# Patient Record
Sex: Female | Born: 1995 | Race: White | Hispanic: No | Marital: Single | Smoking: Never smoker
Health system: Southern US, Community
[De-identification: ages and names within clinical notes are randomized; demographics above are authoritative.]

## PROBLEM LIST (undated history)

## (undated) DIAGNOSIS — M1990 Unspecified osteoarthritis, unspecified site: Secondary | ICD-10-CM

## (undated) DIAGNOSIS — K5090 Crohn's disease, unspecified, without complications: Secondary | ICD-10-CM

## (undated) DIAGNOSIS — M069 Rheumatoid arthritis, unspecified: Secondary | ICD-10-CM

## (undated) HISTORY — PX: KNEE ARTHROSCOPY: SUR90

---

## 2017-01-17 ENCOUNTER — Ambulatory Visit (HOSPITAL_COMMUNITY)
Admission: EM | Admit: 2017-01-17 | Discharge: 2017-01-17 | Disposition: A | Discharge status: Home / Self Care | Payer: BLUE CROSS/BLUE SHIELD | Attending: Internal Medicine | Admitting: Internal Medicine

## 2017-01-17 ENCOUNTER — Encounter (HOSPITAL_COMMUNITY): Payer: Self-pay | Admitting: Emergency Medicine

## 2017-01-17 DIAGNOSIS — J029 Acute pharyngitis, unspecified: Secondary | ICD-10-CM | POA: Insufficient documentation

## 2017-01-17 DIAGNOSIS — R509 Fever, unspecified: Secondary | ICD-10-CM | POA: Diagnosis present

## 2017-01-17 DIAGNOSIS — Z79899 Other long term (current) drug therapy: Secondary | ICD-10-CM | POA: Diagnosis not present

## 2017-01-17 DIAGNOSIS — R51 Headache: Secondary | ICD-10-CM | POA: Insufficient documentation

## 2017-01-17 DIAGNOSIS — R0981 Nasal congestion: Secondary | ICD-10-CM | POA: Diagnosis present

## 2017-01-17 DIAGNOSIS — J069 Acute upper respiratory infection, unspecified: Secondary | ICD-10-CM

## 2017-01-17 LAB — POCT RAPID STREP A: STREPTOCOCCUS, GROUP A SCREEN (DIRECT): NEGATIVE

## 2017-01-17 MED ORDER — FLUTICASONE PROPIONATE 50 MCG/ACT NA SUSP: 1.0000 | Freq: Every day | NASAL | 2 refills | Status: DC

## 2017-01-17 MED ORDER — AMOXICILLIN-POT CLAVULANATE 875-125 MG PO TABS: 1.0000 | ORAL_TABLET | Freq: Two times a day (BID) | ORAL | 0 refills | Status: DC

## 2017-01-17 NOTE — ED Triage Notes (Signed)
Pt here for ST onset 2 days associated w/fevers, odynophagia  Has not had any meds  A&O x4... NAD... Ambulatory

## 2017-01-17 NOTE — Discharge Instructions (Signed)
Continue to push fluids and take over the counter medications as directed on the back of the box for symptomatic relief.  ° °

## 2017-01-17 NOTE — ED Provider Notes (Signed)
[facility]    CSN: [id number] Arrival date & time: 01/17/17  1842     History   Chief Complaint Chief Complaint  Patient presents with  . Sore Throat    HPI Alexandra Sanders is a 21 y.o. female.   21 y.o. female presents with fever, sore throat, headache, sinus pressure and nasal congestion  X 4 days.. Condition is acute  in nature. Condition is made better by nothing. Condition is made worse by nothing. Patient denies any treatment prior to there arrival at this facility. Patient denies any nausea vomiting, diarrhea. and endorses sick contacts.        History reviewed. No pertinent past medical history.  There are no active problems to display for this patient.   History reviewed. No pertinent surgical history.  OB History    No data available       Home Medications    Prior to Admission medications   Medication Sig Start Date End Date Taking? Authorizing Provider  amitriptyline (ELAVIL) 10 MG tablet Take 10 mg by mouth at bedtime.   Yes [provider]  meloxicam (MOBIC) 15 MG tablet Take 15 mg by mouth daily.   Yes [provider]  Norgestimate-Ethinyl Estradiol Triphasic (TRINESSA LO) 0.18/0.215/0.25 MG-25 MCG tab Take 1 tablet by mouth daily.   Yes [provider]    Family History History reviewed. No pertinent family history.  Social History Social History  Substance Use Topics  . Smoking status: Never Smoker  . Smokeless tobacco: Never Used  . Alcohol use Yes     Allergies   Patient has no known allergies.   Review of Systems Review of Systems  Constitutional: Positive for fever. Negative for chills.  HENT: Positive for rhinorrhea, sinus pain, sinus pressure and sore throat. Negative for ear pain.   Eyes: Negative for pain and visual disturbance.  Respiratory: Negative for cough and shortness of breath.   Cardiovascular: Negative for chest pain and palpitations.  Gastrointestinal: Negative for abdominal  pain and vomiting.  Genitourinary: Negative for dysuria and hematuria.  Musculoskeletal: Negative for arthralgias and back pain.  Skin: Negative for color change and rash.  Neurological: Negative for seizures and syncope.  All other systems reviewed and are negative.    Physical Exam Triage Vital Signs ED Triage Vitals  Enc Vitals Group     BP --      Pulse Rate 01/17/17 1951 80     Resp 01/17/17 1951 16     Temp 01/17/17 1951 98.6 F (37 C)     Temp Source 01/17/17 1951 Oral     SpO2 01/17/17 1951 99 %     Weight --      Height --      Head Circumference --      Peak Flow --      Pain Score 01/17/17 1952 3     Pain Loc --      Pain Edu? --      Excl. in GC? --    No data found.   Updated Vital Signs Pulse 80   Temp 98.6 F (37 C) (Oral)   Resp 16   LMP 01/17/2017   SpO2 99%   Visual Acuity Right Eye Distance:   Left Eye Distance:   Bilateral Distance:    Right Eye Near:   Left Eye Near:    Bilateral Near:     Physical Exam  HENT:  Pain with palpation to frontal ethmoid and maxillary sinus  UC Treatments / Results  Labs (all labs ordered are listed, but only abnormal results are displayed) Labs Reviewed  POCT RAPID STREP A    EKG  EKG Interpretation None       Radiology No results found.  Procedures Procedures (including critical care time)  Medications Ordered in UC Medications - No data to display   Initial Impression / Assessment and Plan / UC Course  I have reviewed the triage vital signs and the nursing notes.  Pertinent labs & imaging results that were available during my care of the patient were reviewed by me and considered in my medical decision making (see chart for details).       Final Clinical Impressions(s) / UC Diagnoses   Final diagnoses:  None    New Prescriptions New Prescriptions   No medications on file     Controlled Substance Prescriptions [place] Controlled Substance Registry consulted? Not  Applicable   Alene Mires, NP 01/17/17 2031

## 2017-01-20 LAB — CULTURE, GROUP A STREP (THRC)

## 2017-06-06 ENCOUNTER — Ambulatory Visit (INDEPENDENT_AMBULATORY_CARE_PROVIDER_SITE_OTHER): Payer: BLUE CROSS/BLUE SHIELD

## 2017-06-06 ENCOUNTER — Encounter (HOSPITAL_COMMUNITY): Payer: Self-pay | Admitting: Emergency Medicine

## 2017-06-06 ENCOUNTER — Ambulatory Visit (HOSPITAL_COMMUNITY)
Admission: EM | Admit: 2017-06-06 | Discharge: 2017-06-06 | Disposition: A | Discharge status: Home / Self Care | Payer: BLUE CROSS/BLUE SHIELD | Attending: Family Medicine | Admitting: Family Medicine

## 2017-06-06 DIAGNOSIS — B349 Viral infection, unspecified: Secondary | ICD-10-CM | POA: Diagnosis not present

## 2017-06-06 DIAGNOSIS — R0602 Shortness of breath: Secondary | ICD-10-CM | POA: Diagnosis not present

## 2017-06-06 MED ORDER — CETIRIZINE-PSEUDOEPHEDRINE ER 5-120 MG PO TB12: 1.0000 | ORAL_TABLET | Freq: Every day | ORAL | 0 refills | Status: AC

## 2017-06-06 MED ORDER — FLUTICASONE PROPIONATE 50 MCG/ACT NA SUSP: 2.0000 | Freq: Every day | NASAL | 0 refills | Status: AC

## 2017-06-06 MED ORDER — PREDNISONE 20 MG PO TABS: 40.0000 mg | ORAL_TABLET | Freq: Every day | ORAL | 0 refills | Status: AC

## 2017-06-06 MED ORDER — BENZONATATE 100 MG PO CAPS: 100.0000 mg | ORAL_CAPSULE | Freq: Three times a day (TID) | ORAL | 0 refills | Status: DC

## 2017-06-06 NOTE — ED Provider Notes (Signed)
[facility]    CSN: [id number] Arrival date & time: 06/06/17  [id number]     History   Chief Complaint Chief Complaint  Patient presents with  . URI    HPI Alexandra Sanders is a 22 y.o. female.   22 year old female comes in for 4-day history of URI symptoms.  She has had productive cough that became blood tinged, but coughed up more blood today.  She is also had nasal congestion, rhinorrhea, sore throat. Denies fever, chills, night sweats. OTC DayQuil and NyQuil with some relief. States has had some dyspnea on exertion. Has had some wheezing, denies history of asthma. Never smoker. Negative sick contact.       History reviewed. No pertinent past medical history.  There are no active problems to display for this patient.   History reviewed. No pertinent surgical history.  OB History    No data available       Home Medications    Prior to Admission medications   Medication Sig Start Date End Date Taking? Authorizing Provider  acetaminophen (TYLENOL 8 HOUR ARTHRITIS PAIN) 650 MG CR tablet Take 650 mg by mouth every 8 (eight) hours as needed for pain.   Yes [provider]  amitriptyline (ELAVIL) 10 MG tablet Take 10 mg by mouth at bedtime.   Yes [provider]  meloxicam (MOBIC) 15 MG tablet Take 15 mg by mouth daily.   Yes [provider]  Norgestimate-Ethinyl Estradiol Triphasic (TRINESSA LO) 0.18/0.215/0.25 MG-25 MCG tab Take 1 tablet by mouth daily.   Yes [provider]  traMADol (ULTRAM) 50 MG tablet Take by mouth every 6 (six) hours as needed.   Yes [provider]  benzonatate (TESSALON) 100 MG capsule Take 1 capsule (100 mg total) by mouth every 8 (eight) hours. 06/06/17   Cathie HoopsYu, Kasten Leveque V, PA-C  cetirizine-pseudoephedrine (ZYRTEC-D) 5-120 MG tablet Take 1 tablet by mouth daily. 06/06/17   Cathie HoopsYu, Kailia Starry V, PA-C  fluticasone (FLONASE) 50 MCG/ACT nasal spray Place 2 sprays into both nostrils daily. 06/06/17   Cathie HoopsYu, Fuad Forget V, PA-C    predniSONE (DELTASONE) 20 MG tablet Take 2 tablets (40 mg total) by mouth daily for 4 days. 06/06/17 06/10/17  Belinda FisherYu, Mekai Wilkinson V, PA-C    Family History No family history on file.  Social History Social History   Tobacco Use  . Smoking status: Never Smoker  . Smokeless tobacco: Never Used  Substance Use Topics  . Alcohol use: Yes  . Drug use: No     Allergies   Patient has no known allergies.   Review of Systems Review of Systems  Reason unable to perform ROS: See HPI as above.     Physical Exam Triage Vital Signs ED Triage Vitals [06/06/17 1946]  Enc Vitals Group     BP 130/87     Pulse Rate 88     Resp 14     Temp 98.9 F (37.2 C)     Temp Source Oral     SpO2 100 %     Weight      Height      Head Circumference      Peak Flow      Pain Score      Pain Loc      Pain Edu?      Excl. in GC?    No data found.  Updated Vital Signs BP 130/87 (BP Location: Left Arm)   Pulse 88   Temp 98.9 F (37.2 C) (Oral)  Resp 14   LMP 05/29/2017 (Exact Date)   SpO2 100%   Physical Exam  Constitutional: She is oriented to person, place, and time. She appears well-developed and well-nourished. No distress.  HENT:  Head: Normocephalic and atraumatic.  Right Ear: External ear and ear canal normal. Tympanic membrane is erythematous. Tympanic membrane is not bulging.  Left Ear: External ear and ear canal normal. Tympanic membrane is erythematous. Tympanic membrane is not bulging.  Nose: Mucosal edema and rhinorrhea present. Right sinus exhibits maxillary sinus tenderness and frontal sinus tenderness. Left sinus exhibits maxillary sinus tenderness and frontal sinus tenderness.  Mouth/Throat: Uvula is midline, oropharynx is clear and moist and mucous membranes are normal.  Eyes: Conjunctivae are normal. Pupils are equal, round, and reactive to light.  Neck: Normal range of motion. Neck supple.  Cardiovascular: Normal rate, regular rhythm and normal heart sounds. Exam reveals no  gallop and no friction rub.  No murmur heard. Pulmonary/Chest: Effort normal and breath sounds normal. She has no decreased breath sounds. She has no wheezes. She has no rhonchi. She has no rales.  Lymphadenopathy:    She has no cervical adenopathy.  Neurological: She is alert and oriented to person, place, and time.  Skin: Skin is warm and dry.  Psychiatric: She has a normal mood and affect. Her behavior is normal. Judgment normal.     UC Treatments / Results  Labs (all labs ordered are listed, but only abnormal results are displayed) Labs Reviewed - No data to display  EKG  EKG Interpretation None       Radiology Dg Chest 2 View  Result Date: 06/06/2017 CLINICAL DATA:  Shortness of breath and chest congestion EXAM: CHEST  2 VIEW COMPARISON:  None. FINDINGS: The heart size and mediastinal contours are within normal limits. Both lungs are clear. The visualized skeletal structures are unremarkable. IMPRESSION: No active cardiopulmonary disease. Electronically Signed   By: Gerome Sam III M.D   On: 06/06/2017 20:53    Procedures Procedures (including critical care time)  Medications Ordered in UC Medications - No data to display   Initial Impression / Assessment and Plan / UC Course  I have reviewed the triage vital signs and the nursing notes.  Pertinent labs & imaging results that were available during my care of the patient were reviewed by me and considered in my medical decision making (see chart for details).    Chest x-ray negative for pneumonia.  Discussed with patient most likely due to viral illness.  Symptomatic treatment discussed.  Push fluids.  Return precautions given.  Patient expresses understanding and agrees to plan.   Final Clinical Impressions(s) / UC Diagnoses   Final diagnoses:  Viral syndrome    ED Discharge Orders        Ordered    benzonatate (TESSALON) 100 MG capsule  Every 8 hours     06/06/17 2057    fluticasone (FLONASE) 50  MCG/ACT nasal spray  Daily     06/06/17 2057    cetirizine-pseudoephedrine (ZYRTEC-D) 5-120 MG tablet  Daily     06/06/17 2057    predniSONE (DELTASONE) 20 MG tablet  Daily     06/06/17 2057        Belinda Fisher, PA-C 06/06/17 2100

## 2017-06-06 NOTE — Discharge Instructions (Signed)
Chest xray negative for pneumonia. Prednisone as directed. Tessalon for cough. Start flonase, zyrtec-D for nasal congestion. You can use over the counter nasal saline rinse such as neti pot for nasal congestion. Keep hydrated, your urine should be clear to pale yellow in color. Tylenol/motrin for fever and pain. Monitor for any worsening of symptoms, chest pain, shortness of breath, wheezing, swelling of the throat, follow up for reevaluation.

## 2017-06-06 NOTE — ED Triage Notes (Signed)
Pt c/o cough x 2 days. Pt states her cough has been productive with phlegm and today she coughed up some blood. Patient also has nasal congestion and sore throat with chest pain. Has been taking dayquil and nyquil.

## 2017-07-14 ENCOUNTER — Emergency Department (INDEPENDENT_AMBULATORY_CARE_PROVIDER_SITE_OTHER)
Admission: EM | Admit: 2017-07-14 | Discharge: 2017-07-14 | Disposition: A | Discharge status: Home / Self Care | Payer: BLUE CROSS/BLUE SHIELD | Source: Home / Self Care | Attending: Family Medicine | Admitting: Family Medicine

## 2017-07-14 ENCOUNTER — Other Ambulatory Visit: Payer: Self-pay

## 2017-07-14 DIAGNOSIS — R05 Cough: Secondary | ICD-10-CM

## 2017-07-14 DIAGNOSIS — R053 Chronic cough: Secondary | ICD-10-CM

## 2017-07-14 HISTORY — DX: Unspecified osteoarthritis, unspecified site: M19.90

## 2017-07-14 MED ORDER — AZITHROMYCIN 250 MG PO TABS: ORAL_TABLET | ORAL | 0 refills | Status: DC

## 2017-07-14 MED ORDER — BENZONATATE 200 MG PO CAPS: ORAL_CAPSULE | ORAL | 0 refills | Status: DC

## 2017-07-14 MED ORDER — PREDNISONE 20 MG PO TABS: ORAL_TABLET | ORAL | 0 refills | Status: DC

## 2017-07-14 NOTE — ED Triage Notes (Signed)
Pt has had a cough for over a month, productive at times.  Denies fever.  Per patient, had pneumonia about 1 1/2 months ago.

## 2017-07-14 NOTE — Discharge Instructions (Signed)
Take plain guaifenesin (1200mg extended release tabs such as Mucinex) twice daily, with plenty of water, for cough and congestion.  May add Pseudoephedrine (30mg, one or two every 4 to 6 hours) for sinus congestion.  Get adequate rest.   °May use Afrin nasal spray (or generic oxymetazoline) each morning for about 5 days and then discontinue.  Also recommend using saline nasal spray several times daily and saline nasal irrigation (AYR is a common brand).    °Try warm salt water gargles for sore throat.  °Stop all antihistamines for now, and other non-prescription cough/cold preparations. °May take Delsym Cough Suppressant with Tessalon at bedtime for nighttime cough.  °  °

## 2017-07-14 NOTE — ED Provider Notes (Signed)
[facility]    CSN: [id number] Arrival date & time: 07/14/17  1324     History   Chief Complaint Chief Complaint  Patient presents with  . Cough    HPI Alexandra Sanders is a 22 y.o. female.   Patient complains of persistent non-productive cough since having a cold one month ago (she had negative chest X-ray on 06/06/17).  She feels well, but reports that she often coughs until she gags.  No pleuritic pain or shortness of breath, but does occasionally wheeze.  No recent fevers, chills, and sweats.  She states that she had pneumonia in December 2018.   The history is provided by the patient.    Past Medical History:  Diagnosis Date  . Arthritis    juvenile spondyloarthritis    There are no active problems to display for this patient.   Past Surgical History:  Procedure Laterality Date  . KNEE ARTHROSCOPY      OB History    No data available       Home Medications    Prior to Admission medications   Medication Sig Start Date End Date Taking? Authorizing Provider  acetaminophen (TYLENOL 8 HOUR ARTHRITIS PAIN) 650 MG CR tablet Take 650 mg by mouth every 8 (eight) hours as needed for pain.    [provider]  amitriptyline (ELAVIL) 10 MG tablet Take 10 mg by mouth at bedtime.    [provider]  azithromycin (ZITHROMAX Z-PAK) 250 MG tablet Take 2 tabs today; then begin one tab once daily for 4 more days. 07/14/17   Lattie Haw, MD  benzonatate (TESSALON) 200 MG capsule Take one cap by mouth at bedtime as needed for cough.  May repeat in 4 to 6 hours 07/14/17   Lattie Haw, MD  cetirizine-pseudoephedrine (ZYRTEC-D) 5-120 MG tablet Take 1 tablet by mouth daily. 06/06/17   Cathie Hoops, Amy V, PA-C  fluticasone (FLONASE) 50 MCG/ACT nasal spray Place 2 sprays into both nostrils daily. 06/06/17   Cathie Hoops, Amy V, PA-C  meloxicam (MOBIC) 15 MG tablet Take 15 mg by mouth daily.    [provider]  Norgestimate-Ethinyl Estradiol Triphasic (TRINESSA LO)  0.18/0.215/0.25 MG-25 MCG tab Take 1 tablet by mouth daily.    [provider]  predniSONE (DELTASONE) 20 MG tablet Take one tab by mouth twice daily for 4 days, then one daily for 3 days. Take with food. 07/14/17   Lattie Haw, MD  traMADol (ULTRAM) 50 MG tablet Take by mouth every 6 (six) hours as needed.    [provider]    Family History No family history on file.  Social History Social History   Tobacco Use  . Smoking status: Never Smoker  . Smokeless tobacco: Never Used  Substance Use Topics  . Alcohol use: Yes  . Drug use: No     Allergies   Patient has no known allergies.   Review of Systems Review of Systems No sore throat + cough No pleuritic pain ? wheezing + nasal congestion No post-nasal drainage No sinus pain/pressure No itchy/red eyes No earache No hemoptysis No SOB No fever/chills No nausea No vomiting No abdominal pain No diarrhea No urinary symptoms No skin rash No fatigue No myalgias No headache Used OTC meds without relief   Physical Exam Triage Vital Signs ED Triage Vitals  Enc Vitals Group     BP 07/14/17 0955 123/81     Pulse Rate 07/14/17 0955 77     Resp --  Temp 07/14/17 0955 97.9 F (36.6 C)     Temp Source 07/14/17 0955 Oral     SpO2 07/14/17 0955 99 %     Weight 07/14/17 0957 215 lb (97.5 kg)     Height 07/14/17 0957 5\' 9"  (1.753 m)     Head Circumference --      Peak Flow --      Pain Score 07/14/17 0957 2     Pain Loc --      Pain Edu? --      Excl. in GC? --    No data found.  Updated Vital Signs BP 123/81 (BP Location: Right Arm)   Pulse 77   Temp 97.9 F (36.6 C) (Oral)   Ht 5\' 9"  (1.753 m)   Wt 215 lb (97.5 kg)   LMP 07/14/2017   SpO2 99%   BMI 31.75 kg/m   Visual Acuity Right Eye Distance:   Left Eye Distance:   Bilateral Distance:    Right Eye Near:   Left Eye Near:    Bilateral Near:     Physical Exam Nursing notes and Vital Signs reviewed. Appearance:   Patient appears stated age, and in no acute distress Eyes:  Pupils are equal, round, and reactive to light and accomodation.  Extraocular movement is intact.  Conjunctivae are not inflamed  Ears:  Canals normal.  Tympanic membranes normal.  Nose:  Congested turbinates.  No sinus tenderness.  Pharynx:  Normal Neck:  Supple.  No adenopathy.   Lungs:  Clear to auscultation.  Breath sounds are equal.  Moving air well. Heart:  Regular rate and rhythm without murmurs, rubs, or gallops.  Abdomen:  Nontender without masses or hepatosplenomegaly.  Bowel sounds are present.  No CVA or flank tenderness.  Extremities:  No edema.  Skin:  No rash present.    UC Treatments / Results  Labs (all labs ordered are listed, but only abnormal results are displayed) Labs Reviewed - No data to display  EKG  EKG Interpretation None       Radiology No results found.  Procedures Procedures (including critical care time)  Medications Ordered in UC Medications - No data to display   Initial Impression / Assessment and Plan / UC Course  I have reviewed the triage vital signs and the nursing notes.  Pertinent labs & imaging results that were available during my care of the patient were reviewed by me and considered in my medical decision making (see chart for details).    Begin Z-pak for atypical coverage. Begin prednisone burst/taper. Prescription written for Benzonatate [facility]) to take at bedtime for night-time cough.  Take plain guaifenesin (1200mg  extended release tabs such as Mucinex) twice daily, with plenty of water, for cough and congestion.  May add Pseudoephedrine (30mg , one or two every 4 to 6 hours) for sinus congestion.  Get adequate rest.   May use Afrin nasal spray (or generic oxymetazoline) each morning for about 5 days and then discontinue.  Also recommend using saline nasal spray several times daily and saline nasal irrigation (AYR is a common brand).   Try warm salt water gargles  for sore throat.  Stop all antihistamines for now, and other non-prescription cough/cold preparations. May take Delsym Cough Suppressant with Tessalon at bedtime for nighttime cough.  Followup with Family Doctor if not improved in one week.     Final Clinical Impressions(s) / UC Diagnoses   Final diagnoses:  Persistent cough for 3 weeks or longer  ED Discharge Orders        Ordered    predniSONE (DELTASONE) 20 MG tablet     07/14/17 1025    azithromycin (ZITHROMAX Z-PAK) 250 MG tablet     07/14/17 1025    benzonatate (TESSALON) 200 MG capsule     07/14/17 1025          Lattie HawBeese, Cristle Jared A, MD 07/14/17 1032

## 2017-08-18 ENCOUNTER — Other Ambulatory Visit: Payer: Self-pay

## 2017-08-18 ENCOUNTER — Emergency Department (INDEPENDENT_AMBULATORY_CARE_PROVIDER_SITE_OTHER)
Admission: EM | Admit: 2017-08-18 | Discharge: 2017-08-18 | Disposition: A | Discharge status: Home / Self Care | Payer: BLUE CROSS/BLUE SHIELD | Source: Home / Self Care | Attending: Family Medicine | Admitting: Family Medicine

## 2017-08-18 DIAGNOSIS — M47819 Spondylosis without myelopathy or radiculopathy, site unspecified: Secondary | ICD-10-CM

## 2017-08-18 MED ORDER — PREDNISONE 20 MG PO TABS: ORAL_TABLET | ORAL | 0 refills | Status: AC

## 2017-08-18 NOTE — ED Provider Notes (Signed)
[facility]    CSN: [id number] Arrival date & time: 08/18/17  1456     History   Chief Complaint Chief Complaint  Patient presents with  . Joint Swelling    HPI Alexandra Sanders is a 22 y.o. female.   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  She has pain in her lower back, knees (left worse than right), left wrist, and ankles (left worse than right).  She has had poor response from Mobic and tramadol  The history is provided by the patient.    Past Medical History:  Diagnosis Date  . Arthritis    juvenile spondyloarthritis    There are no active problems to display for this patient.   Past Surgical History:  Procedure Laterality Date  . KNEE ARTHROSCOPY      OB History   None      Home Medications    Prior to Admission medications   Medication Sig Start Date End Date Taking? Authorizing Provider  acetaminophen (TYLENOL 8 HOUR ARTHRITIS PAIN) 650 MG CR tablet Take 650 mg by mouth every 8 (eight) hours as needed for pain.    [provider]  amitriptyline (ELAVIL) 10 MG tablet Take 10 mg by mouth at bedtime.    [provider]  cetirizine-pseudoephedrine (ZYRTEC-D) 5-120 MG tablet Take 1 tablet by mouth daily. 06/06/17   Cathie Hoops, Amy V, PA-C  fluticasone (FLONASE) 50 MCG/ACT nasal spray Place 2 sprays into both nostrils daily. 06/06/17   Cathie Hoops, Amy V, PA-C  meloxicam (MOBIC) 15 MG tablet Take 15 mg by mouth daily.    [provider]  Norgestimate-Ethinyl Estradiol Triphasic (TRINESSA LO) 0.18/0.215/0.25 MG-25 MCG tab Take 1 tablet by mouth daily.    [provider]  predniSONE (DELTASONE) 20 MG tablet Take one tab by mouth twice daily for 4 days, then one daily. Take with food. 08/18/17   Lattie Haw, MD  traMADol (ULTRAM) 50 MG tablet Take by mouth every 6 (six) hours as needed.    [provider]    Family History Family History  Problem Relation Age of Onset  .  Hypothyroidism Mother   . Polycystic ovary syndrome Mother   . Hyperlipidemia Father     Social History Social History   Tobacco Use  . Smoking status: Never Smoker  . Smokeless tobacco: Never Used  Substance Use Topics  . Alcohol use: Yes  . Drug use: No     Allergies   Patient has no known allergies.   Review of Systems Review of Systems  Constitutional: Positive for activity change and fatigue. Negative for appetite change, chills, diaphoresis and fever.  HENT: Negative.   Eyes: Negative.   Respiratory: Negative.   Cardiovascular: Negative.   Gastrointestinal: Negative.   Genitourinary: Negative.   Musculoskeletal: Positive for arthralgias, back pain and joint swelling.  Neurological: Negative.      Physical Exam Triage Vital Signs ED Triage Vitals  Enc Vitals Group     BP 08/18/17 1530 133/83     Pulse Rate 08/18/17 1530 87     Resp 08/18/17 1530 18     Temp 08/18/17 1530 98.4 F (36.9 C)     Temp Source 08/18/17 1530 Oral     SpO2 08/18/17 1530 100 %     Weight 08/18/17 1531 218 lb (98.9 kg)     Height --      Head Circumference --      Peak Flow --  Pain Score 08/18/17 1531 0     Pain Loc --      Pain Edu? --      Excl. in GC? --    No data found.  Updated Vital Signs BP 133/83 (BP Location: Right Arm)   Pulse 87   Temp 98.4 F (36.9 C) (Oral)   Resp 18   Wt 218 lb (98.9 kg)   LMP 07/28/2017 (Approximate)   SpO2 100%   BMI 32.19 kg/m   Visual Acuity Right Eye Distance:   Left Eye Distance:   Bilateral Distance:    Right Eye Near:   Left Eye Near:    Bilateral Near:     Physical Exam Nursing notes and Vital Signs reviewed. Appearance:  Patient appears stated age, and in no acute distress.    Eyes:  Pupils are equal, round, and reactive to light and accomodation.  Extraocular movement is intact.  Conjunctivae are not inflamed   Pharynx:  Normal; moist mucous membranes  Neck:  Supple.  No adenopathy Lungs:  Clear to  auscultation.  Breath sounds are equal.  Moving air well. Heart:  Regular rate and rhythm without murmurs, rubs, or gallops.  Abdomen:  Nontender without masses or hepatosplenomegaly.  Bowel sounds are present.  No CVA or flank tenderness.  Extremities:  No edema.  Vague tenderness knees, left wrist, and ankles.   Back:  Tenderness over both SI joints, left more than right. Skin:  No rash present.     UC Treatments / Results  Labs (all labs ordered are listed, but only abnormal results are displayed) Labs Reviewed - No data to display  EKG None Radiology No results found.  Procedures Procedures (including critical care time)  Medications Ordered in UC Medications - No data to display   Initial Impression / Assessment and Plan / UC Course  I have reviewed the triage vital signs and the nursing notes.  Pertinent labs & imaging results that were available during my care of the patient were reviewed by me and considered in my medical decision making (see chart for details).    Begin prednisone burst/taper. Discontinue Mobic while taking prednisone. Followup with Family Doctor if not improved in one week.     Final Clinical Impressions(s) / UC Diagnoses   Final diagnoses:  Spondyloarthritis    ED Discharge Orders        Ordered    predniSONE (DELTASONE) 20 MG tablet     08/18/17 1605         Lattie HawBeese, Rashiya Lofland A, MD 08/26/17 1541

## 2017-08-18 NOTE — Discharge Instructions (Addendum)
Discontinue Mobic while taking prednisone.

## 2017-08-18 NOTE — ED Triage Notes (Signed)
Pt c/o flare up to her spondyloarthritis. Started yesterday. Took tramadol last night and today.

## 2019-03-26 IMAGING — DX DG CHEST 2V
2 series · 2 of 2 positions shown · non-contrast
Comparison: None.

CLINICAL DATA: Shortness of breath and chest congestion

EXAM:
CHEST  2 VIEW

[chest pa]
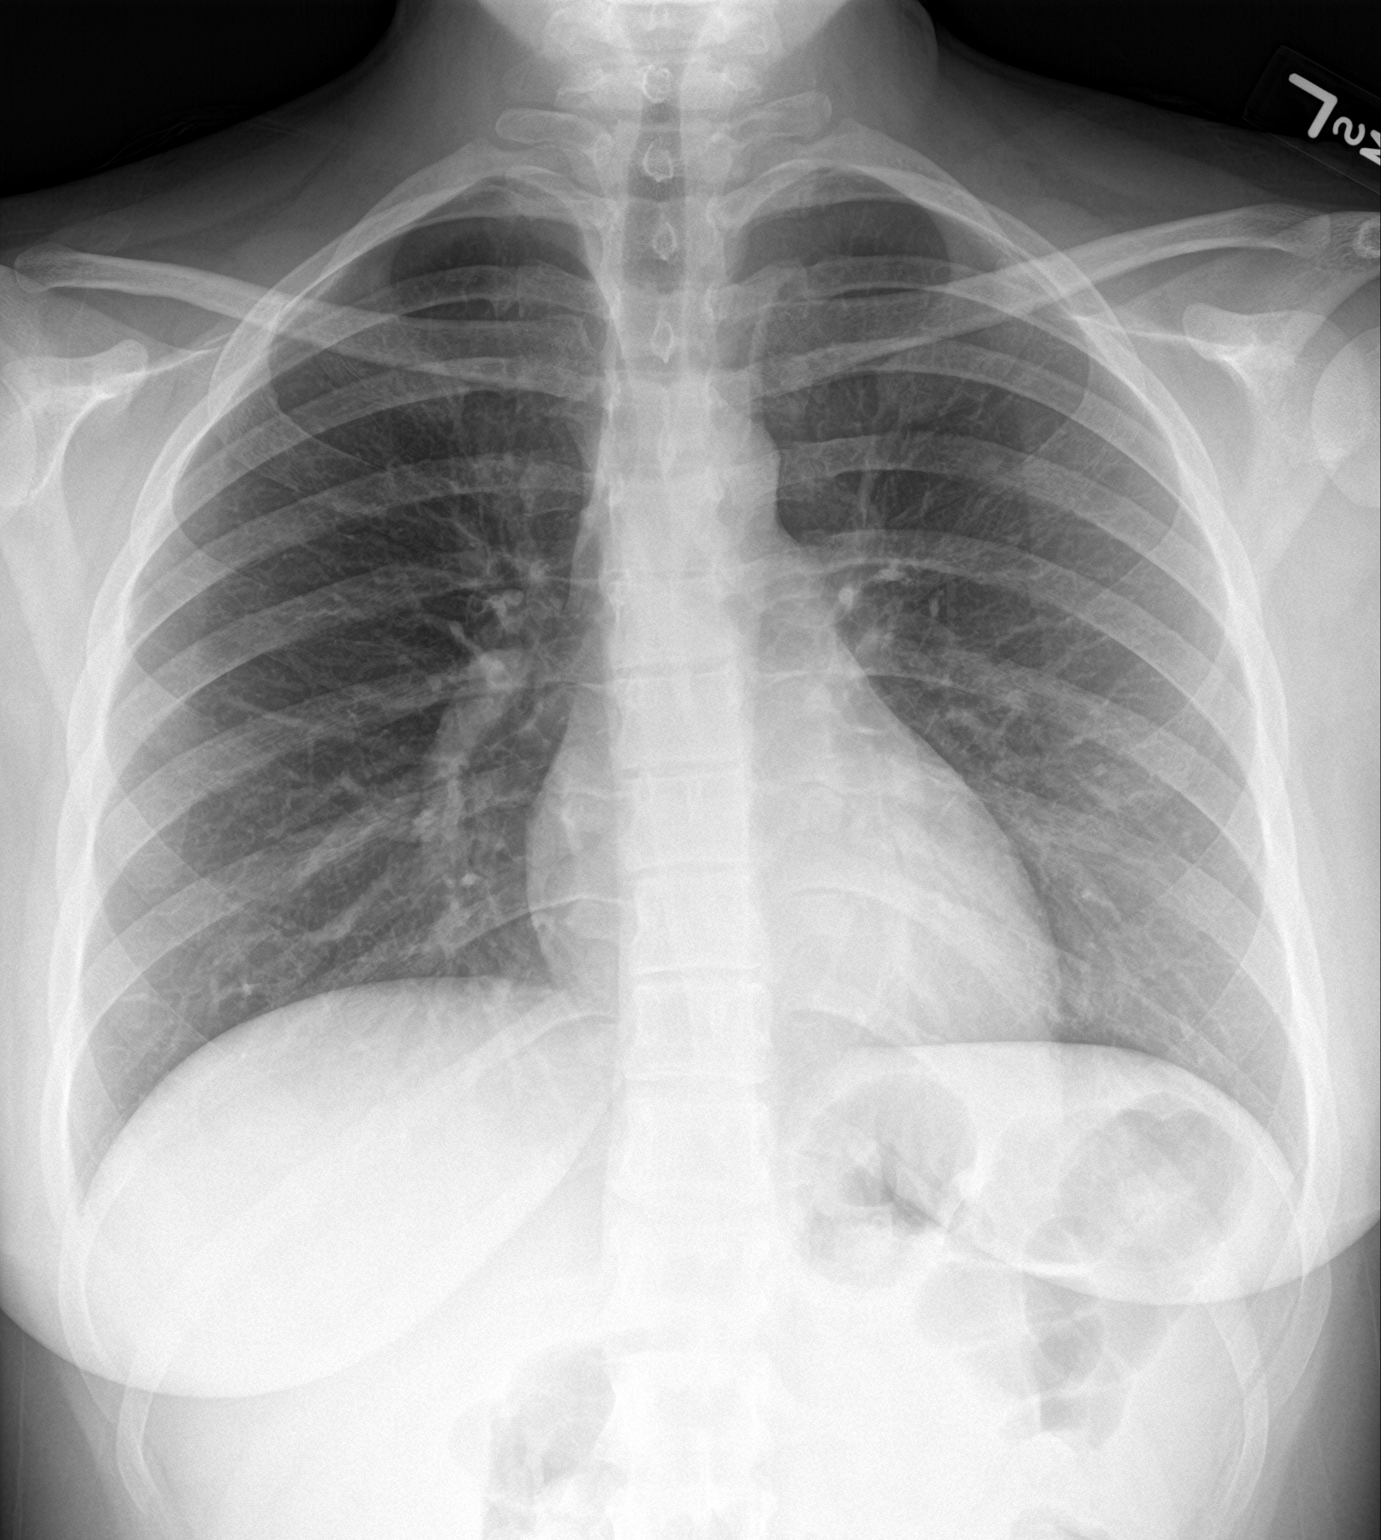

[chest lat]
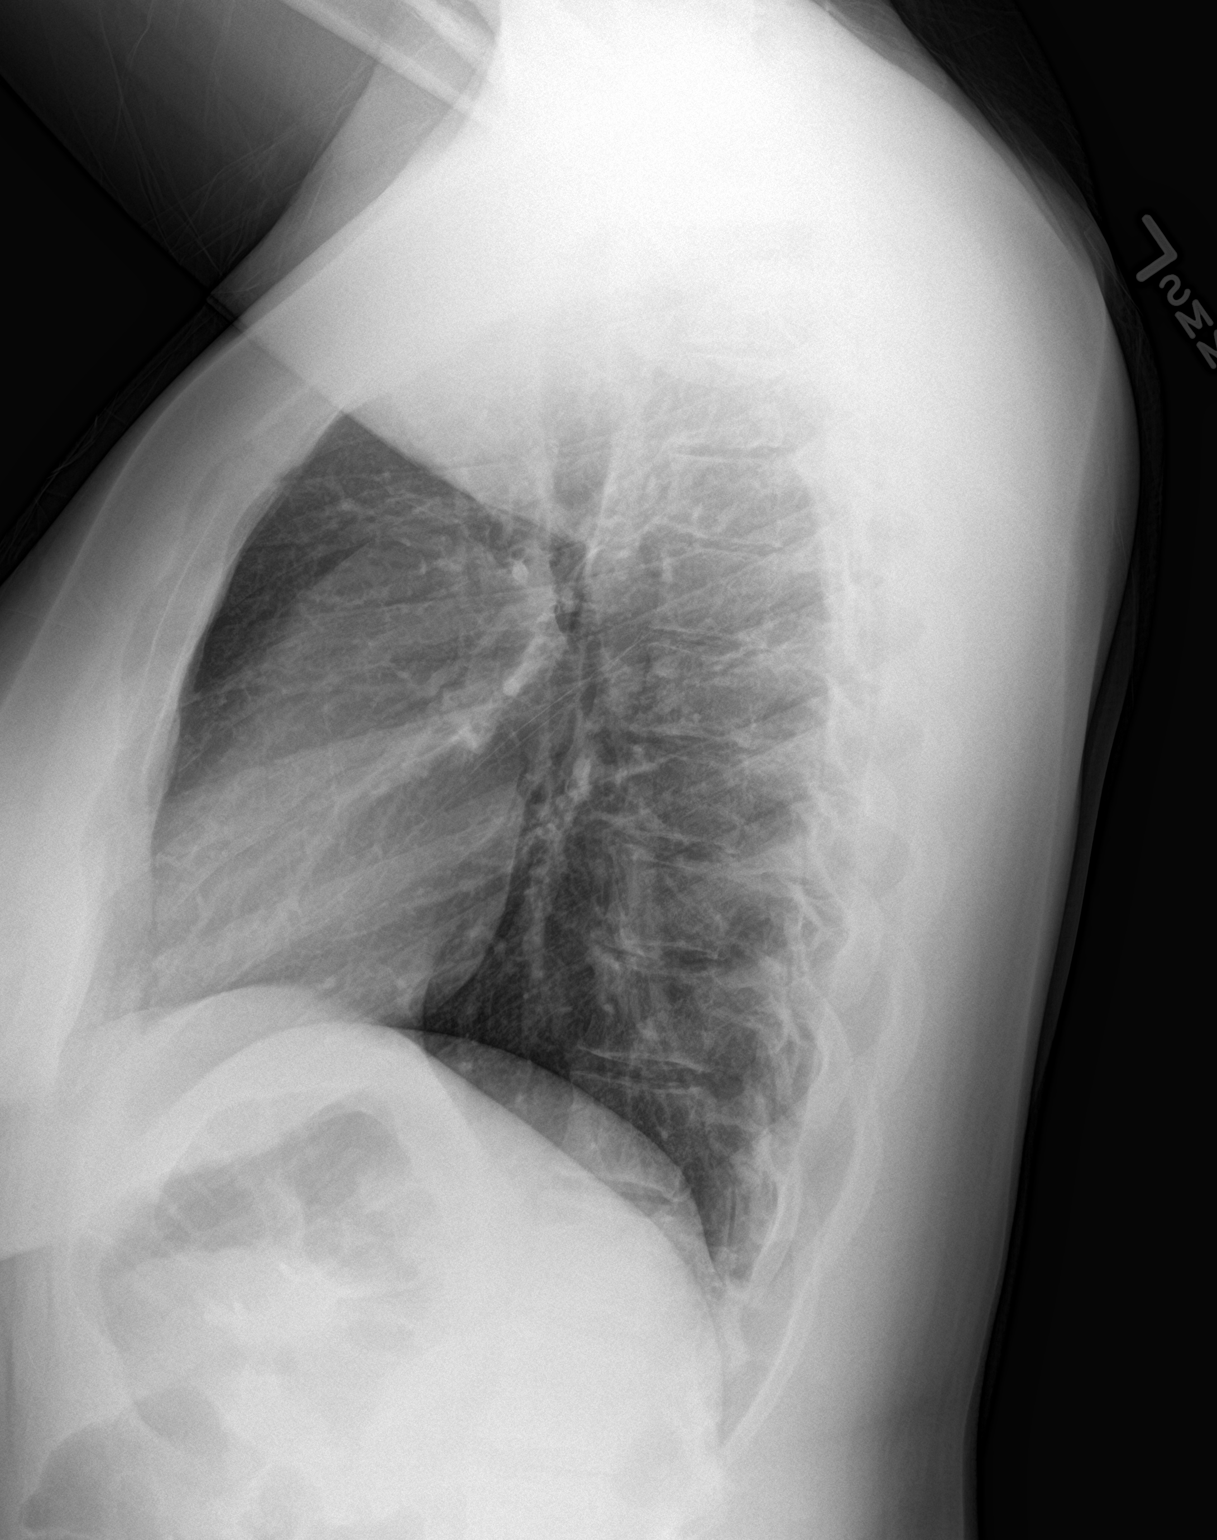

[2 of 2 positions shown; findings below may reference images not displayed]

FINDINGS: The heart size and mediastinal contours are within normal limits.
Both lungs are clear. The visualized skeletal structures are
unremarkable.
IMPRESSION: No active cardiopulmonary disease.

## 2024-04-23 DIAGNOSIS — S161XXA Strain of muscle, fascia and tendon at neck level, initial encounter: Secondary | ICD-10-CM | POA: Insufficient documentation

## 2024-04-23 DIAGNOSIS — M545 Low back pain, unspecified: Secondary | ICD-10-CM | POA: Insufficient documentation

## 2024-04-23 DIAGNOSIS — M546 Pain in thoracic spine: Secondary | ICD-10-CM | POA: Insufficient documentation

## 2024-04-23 DIAGNOSIS — S060X9A Concussion with loss of consciousness of unspecified duration, initial encounter: Secondary | ICD-10-CM | POA: Insufficient documentation

## 2024-04-23 DIAGNOSIS — W1809XA Striking against other object with subsequent fall, initial encounter: Secondary | ICD-10-CM | POA: Insufficient documentation

## 2024-04-23 HISTORY — DX: Crohn's disease, unspecified, without complications: K50.90

## 2024-04-23 HISTORY — DX: Rheumatoid arthritis, unspecified: M06.9

## 2024-04-23 MED ADMIN — Lactated Ringer's Solution: 1000 mL | INTRAVENOUS | NDC 00264775000

## 2024-04-23 MED ADMIN — Iohexol IV Soln 350 MG/ML: 80 mL | INTRAVENOUS | NDC 00407141491

## 2024-04-23 MED ADMIN — Ondansetron HCl Inj 4 MG/2ML (2 MG/ML): 4 mg | INTRAVENOUS | NDC 00409475518

## 2024-04-23 MED ADMIN — Fentanyl Citrate PF Soln Prefilled Syringe 50 MCG/ML: 50 ug | INTRAVENOUS | NDC 63323080801

## 2024-04-23 MED FILL — Fentanyl Citrate PF Soln Prefilled Syringe 50 MCG/ML: 50.0000 ug | INTRAMUSCULAR | Qty: 1 | Status: AC

## 2024-04-23 MED FILL — Ondansetron HCl Inj 4 MG/2ML (2 MG/ML): 4.0000 mg | INTRAMUSCULAR | Qty: 2 | Status: AC
